# Patient Record
Sex: Male | Born: 1975 | Race: Black or African American | Hispanic: No
Health system: Southern US, Community
[De-identification: ages and names within clinical notes are randomized; demographics above are authoritative.]

## PROBLEM LIST (undated history)

## (undated) DIAGNOSIS — I10 Essential (primary) hypertension: Secondary | ICD-10-CM

---

## 2015-02-13 ENCOUNTER — Emergency Department (HOSPITAL_COMMUNITY): Payer: Medicaid - Out of State

## 2015-02-13 ENCOUNTER — Emergency Department (HOSPITAL_COMMUNITY)
Admission: EM | Admit: 2015-02-13 | Discharge: 2015-02-14 | Disposition: A | Discharge status: Home / Self Care | Payer: Medicaid - Out of State | Attending: Emergency Medicine | Admitting: Emergency Medicine

## 2015-02-13 ENCOUNTER — Encounter (HOSPITAL_COMMUNITY): Payer: Self-pay | Admitting: Emergency Medicine

## 2015-02-13 DIAGNOSIS — R000 Tachycardia, unspecified: Secondary | ICD-10-CM | POA: Diagnosis not present

## 2015-02-13 DIAGNOSIS — R110 Nausea: Secondary | ICD-10-CM

## 2015-02-13 DIAGNOSIS — F1090 Alcohol use, unspecified, uncomplicated: Secondary | ICD-10-CM

## 2015-02-13 DIAGNOSIS — Z79899 Other long term (current) drug therapy: Secondary | ICD-10-CM | POA: Insufficient documentation

## 2015-02-13 DIAGNOSIS — I10 Essential (primary) hypertension: Secondary | ICD-10-CM | POA: Insufficient documentation

## 2015-02-13 DIAGNOSIS — Z7289 Other problems related to lifestyle: Secondary | ICD-10-CM

## 2015-02-13 DIAGNOSIS — Z789 Other specified health status: Secondary | ICD-10-CM

## 2015-02-13 DIAGNOSIS — F10929 Alcohol use, unspecified with intoxication, unspecified: Secondary | ICD-10-CM | POA: Diagnosis not present

## 2015-02-13 DIAGNOSIS — R079 Chest pain, unspecified: Secondary | ICD-10-CM | POA: Diagnosis present

## 2015-02-13 DIAGNOSIS — R0789 Other chest pain: Secondary | ICD-10-CM

## 2015-02-13 HISTORY — DX: Essential (primary) hypertension: I10

## 2015-02-13 LAB — URINALYSIS, ROUTINE W REFLEX MICROSCOPIC
BILIRUBIN URINE: NEGATIVE
GLUCOSE, UA: 100 mg/dL — AB
HGB URINE DIPSTICK: NEGATIVE
KETONES UR: NEGATIVE mg/dL
Leukocytes, UA: NEGATIVE
Nitrite: NEGATIVE
PH: 6.5 (ref 5.0–8.0)
Protein, ur: 30 mg/dL — AB
SPECIFIC GRAVITY, URINE: 1.026 (ref 1.005–1.030)

## 2015-02-13 LAB — COMPREHENSIVE METABOLIC PANEL
ALBUMIN: 4.3 g/dL (ref 3.5–5.0)
ALT: 17 U/L (ref 17–63)
ANION GAP: 11 (ref 5–15)
AST: 21 U/L (ref 15–41)
Alkaline Phosphatase: 67 U/L (ref 38–126)
BUN: 9 mg/dL (ref 6–20)
CHLORIDE: 107 mmol/L (ref 101–111)
CO2: 26 mmol/L (ref 22–32)
Calcium: 8.7 mg/dL — ABNORMAL LOW (ref 8.9–10.3)
Creatinine, Ser: 0.75 mg/dL (ref 0.61–1.24)
GFR calc Af Amer: >60 mL/min (ref >60)
Glucose, Bld: 142 mg/dL — ABNORMAL HIGH (ref 65–99)
POTASSIUM: 3.5 mmol/L (ref 3.5–5.1)
Sodium: 144 mmol/L (ref 135–145)
Total Bilirubin: 0.5 mg/dL (ref 0.3–1.2)
Total Protein: 7.5 g/dL (ref 6.5–8.1)

## 2015-02-13 LAB — CBC WITH DIFFERENTIAL/PLATELET
BASOS ABS: 0 10*3/uL (ref 0.0–0.1)
BASOS PCT: 0 %
Eosinophils Absolute: 0.1 10*3/uL (ref 0.0–0.7)
Eosinophils Relative: 1 %
HEMATOCRIT: 46.4 % (ref 39.0–52.0)
HEMOGLOBIN: 16.1 g/dL (ref 13.0–17.0)
LYMPHS PCT: 28 %
Lymphs Abs: 1.6 10*3/uL (ref 0.7–4.0)
MCH: 33.7 pg (ref 26.0–34.0)
MCHC: 34.7 g/dL (ref 30.0–36.0)
MCV: 97.1 fL (ref 78.0–100.0)
MONO ABS: 0.6 10*3/uL (ref 0.1–1.0)
Monocytes Relative: 11 %
NEUTROS ABS: 3.4 10*3/uL (ref 1.7–7.7)
NEUTROS PCT: 60 %
Platelets: 212 10*3/uL (ref 150–400)
RBC: 4.78 MIL/uL (ref 4.22–5.81)
RDW: 14.2 % (ref 11.5–15.5)
WBC: 5.7 10*3/uL (ref 4.0–10.5)

## 2015-02-13 LAB — I-STAT TROPONIN, ED
Troponin i, poc: 0 ng/mL (ref 0.00–0.08)
Troponin i, poc: 0 ng/mL (ref 0.00–0.08)

## 2015-02-13 LAB — URINE MICROSCOPIC-ADD ON: BACTERIA UA: NONE SEEN

## 2015-02-13 LAB — RAPID URINE DRUG SCREEN, HOSP PERFORMED
AMPHETAMINES: NOT DETECTED
BARBITURATES: NOT DETECTED
BENZODIAZEPINES: NOT DETECTED
COCAINE: NOT DETECTED
Opiates: NOT DETECTED
TETRAHYDROCANNABINOL: NOT DETECTED

## 2015-02-13 LAB — ACETAMINOPHEN LEVEL: Acetaminophen (Tylenol), Serum: <10 ug/mL — ABNORMAL LOW (ref 10–30)

## 2015-02-13 LAB — CBG MONITORING, ED: GLUCOSE-CAPILLARY: 118 mg/dL — AB (ref 65–99)

## 2015-02-13 LAB — SALICYLATE LEVEL

## 2015-02-13 LAB — ETHANOL: ALCOHOL ETHYL (B): 119 mg/dL — AB (ref <5)

## 2015-02-13 MED ORDER — NITROGLYCERIN 0.4 MG SL SUBL: 0.4000 mg | SUBLINGUAL_TABLET | SUBLINGUAL | Status: DC | PRN

## 2015-02-13 MED ORDER — ASPIRIN 81 MG PO CHEW
324.0000 mg | CHEWABLE_TABLET | Freq: Once | ORAL | Status: AC
  Administered 2015-02-13: 324 mg via ORAL
  Filled 2015-02-13: qty 4

## 2015-02-13 MED ORDER — SODIUM CHLORIDE 0.9 % IV BOLUS (SEPSIS)
1000.0000 mL | Freq: Once | INTRAVENOUS | Status: AC
  Administered 2015-02-13: 1000 mL via INTRAVENOUS

## 2015-02-13 MED ORDER — ONDANSETRON HCL 4 MG/2ML IJ SOLN
4.0000 mg | Freq: Once | INTRAMUSCULAR | Status: AC
  Administered 2015-02-13: 4 mg via INTRAVENOUS
  Filled 2015-02-13: qty 2

## 2015-02-13 NOTE — Discharge Instructions (Signed)
Nonspecific Chest Pain  °Chest pain can be caused by many different conditions. There is always a chance that your pain could be related to something serious, such as a heart attack or a blood clot in your lungs. Chest pain can also be caused by conditions that are not life-threatening. If you have chest pain, it is very important to follow up with your health care provider. °CAUSES  °Chest pain can be caused by: °· Heartburn. °· Pneumonia or bronchitis. °· Anxiety or stress. °· Inflammation around your heart (pericarditis) or lung (pleuritis or pleurisy). °· A blood clot in your lung. °· A collapsed lung (pneumothorax). It can develop suddenly on its own (spontaneous pneumothorax) or from trauma to the chest. °· Shingles infection (varicella-zoster virus). °· Heart attack. °· Damage to the bones, muscles, and cartilage that make up your chest wall. This can include: °¨ Bruised bones due to injury. °¨ Strained muscles or cartilage due to frequent or repeated coughing or overwork. °¨ Fracture to one or more ribs. °¨ Sore cartilage due to inflammation (costochondritis). °RISK FACTORS  °Risk factors for chest pain may include: °· Activities that increase your risk for trauma or injury to your chest. °· Respiratory infections or conditions that cause frequent coughing. °· Medical conditions or overeating that can cause heartburn. °· Heart disease or family history of heart disease. °· Conditions or health behaviors that increase your risk of developing a blood clot. °· Having had chicken pox (varicella zoster). °SIGNS AND SYMPTOMS °Chest pain can feel like: °· Burning or tingling on the surface of your chest or deep in your chest. °· Crushing, pressure, aching, or squeezing pain. °· Dull or sharp pain that is worse when you move, cough, or take a deep breath. °· Pain that is also felt in your back, neck, shoulder, or arm, or pain that spreads to any of these areas. °Your chest pain may come and go, or it may stay  constant. °DIAGNOSIS °Lab tests or other studies may be needed to find the cause of your pain. Your health care provider may have you take a test called an ambulatory ECG (electrocardiogram). An ECG records your heartbeat patterns at the time the test is performed. You may also have other tests, such as: °· Transthoracic echocardiogram (TTE). During echocardiography, sound waves are used to create a picture of all of the heart structures and to look at how blood flows through your heart. °· Transesophageal echocardiogram (TEE). This is a more advanced imaging test that obtains images from inside your body. It allows your health care provider to see your heart in finer detail. °· Cardiac monitoring. This allows your health care provider to monitor your heart rate and rhythm in real time. °· Holter monitor. This is a portable device that records your heartbeat and can help to diagnose abnormal heartbeats. It allows your health care provider to track your heart activity for several days, if needed. °· Stress tests. These can be done through exercise or by taking medicine that makes your heart beat more quickly. °· Blood tests. °· Imaging tests. °TREATMENT  °Your treatment depends on what is causing your chest pain. Treatment may include: °· Medicines. These may include: °¨ Acid blockers for heartburn. °¨ Anti-inflammatory medicine. °¨ Pain medicine for inflammatory conditions. °¨ Antibiotic medicine, if an infection is present. °¨ Medicines to dissolve blood clots. °¨ Medicines to treat coronary artery disease. °· Supportive care for conditions that do not require medicines. This may include: °¨ Resting. °¨ Applying heat   or cold packs to injured areas. °¨ Limiting activities until pain decreases. °HOME CARE INSTRUCTIONS °· If you were prescribed an antibiotic medicine, finish it all even if you start to feel better. °· Avoid any activities that bring on chest pain. °· Do not use any tobacco products, including  cigarettes, chewing tobacco, or electronic cigarettes. If you need help quitting, ask your health care provider. °· Do not drink alcohol. °· Take medicines only as directed by your health care provider. °· Keep all follow-up visits as directed by your health care provider. This is important. This includes any further testing if your chest pain does not go away. °· If heartburn is the cause for your chest pain, you may be told to keep your head raised (elevated) while sleeping. This reduces the chance that acid will go from your stomach into your esophagus. °· Make lifestyle changes as directed by your health care provider. These may include: °¨ Getting regular exercise. Ask your health care provider to suggest some activities that are safe for you. °¨ Eating a heart-healthy diet. A registered dietitian can help you to learn healthy eating options. °¨ Maintaining a healthy weight. °¨ Managing diabetes, if necessary. °¨ Reducing stress. °SEEK MEDICAL CARE IF: °· Your chest pain does not go away after treatment. °· You have a rash with blisters on your chest. °· You have a fever. °SEEK IMMEDIATE MEDICAL CARE IF:  °· Your chest pain is worse. °· You have an increasing cough, or you cough up blood. °· You have severe abdominal pain. °· You have severe weakness. °· You faint. °· You have chills. °· You have sudden, unexplained chest discomfort. °· You have sudden, unexplained discomfort in your arms, back, neck, or jaw. °· You have shortness of breath at any time. °· You suddenly start to sweat, or your skin gets clammy. °· You feel nauseous or you vomit. °· You suddenly feel light-headed or dizzy. °· Your heart begins to beat quickly, or it feels like it is skipping beats. °These symptoms may represent a serious problem that is an emergency. Do not wait to see if the symptoms will go away. Get medical help right away. Call your local emergency services (911 in the [place] Do not drive yourself to the hospital. °  °This  information is not intended to replace advice given to you by your health care provider. Make sure you discuss any questions you have with your health care provider. °  °Document Released: 11/26/2004 Document Revised: 03/09/2014 Document Reviewed: 09/22/2013 °[employer]vier Interactive Patient Education ©2016 [employer] ° °
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
## 2015-02-13 NOTE — ED Notes (Signed)
Nurse drawing labs. 
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
## 2016-06-30 IMAGING — CR DG CHEST 2V
2 series · 2 of 2 positions shown · non-contrast
Comparison: None.

CLINICAL DATA: Chest pain

EXAM:
CHEST  2 VIEW

[w chest pa]
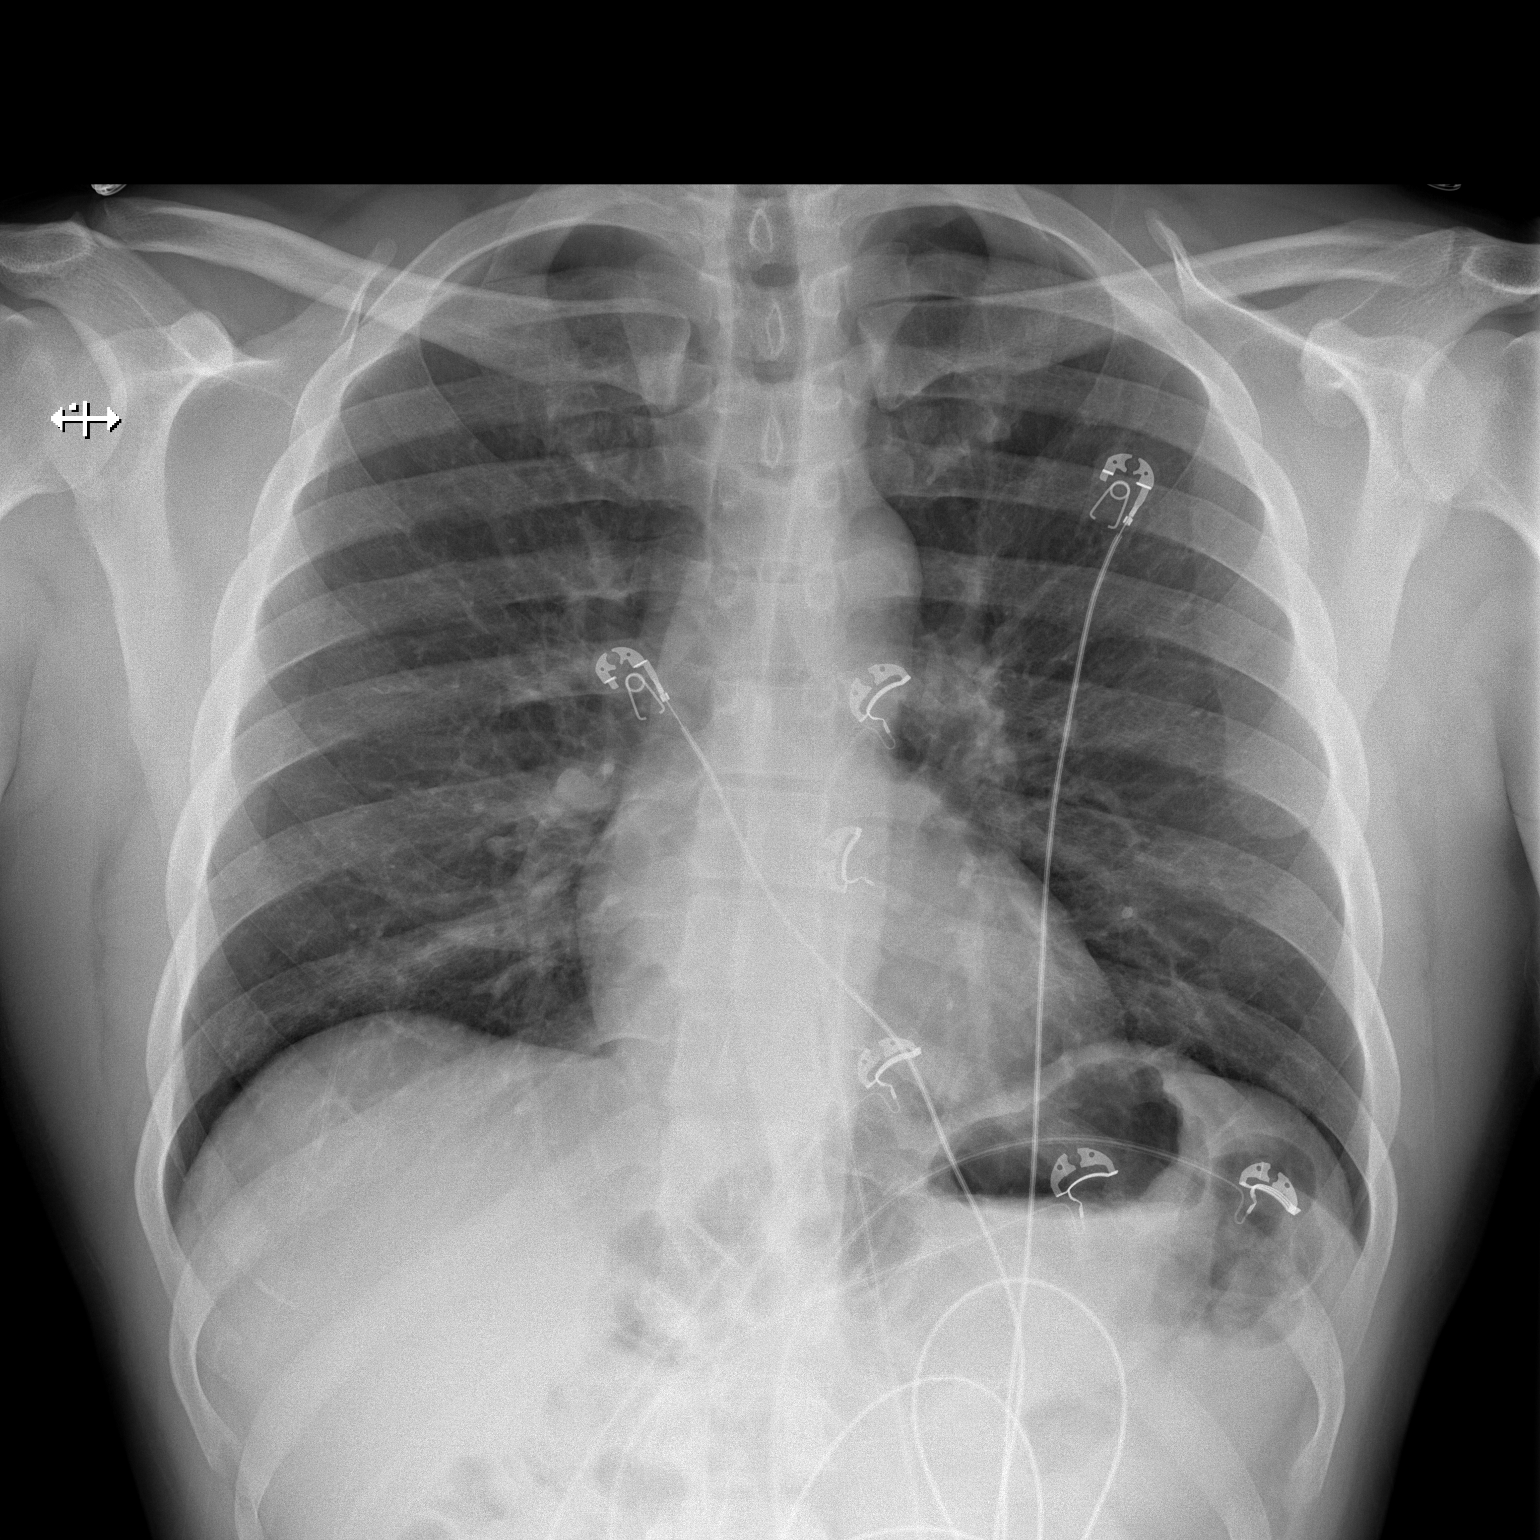

[w chest lat]
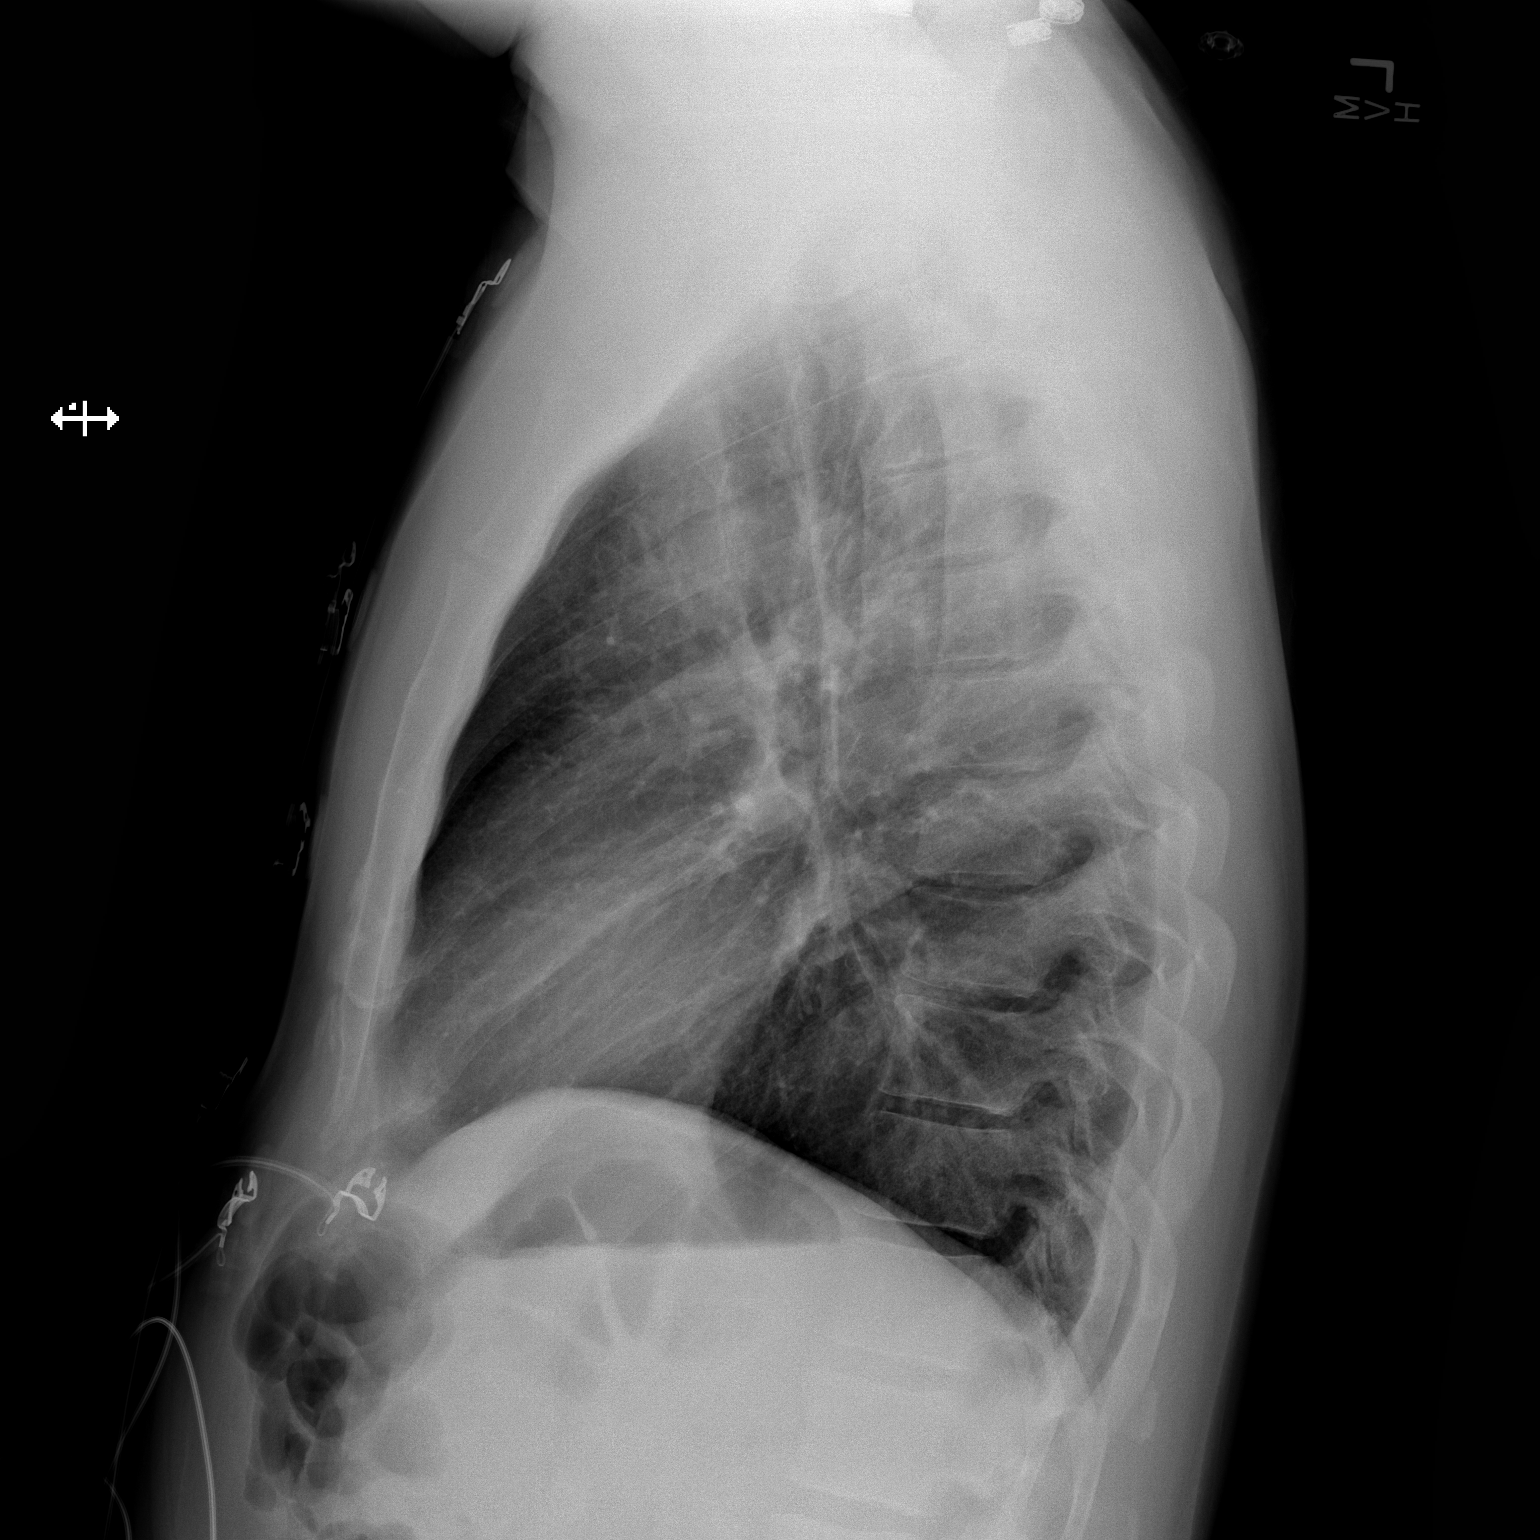

[2 of 2 positions shown; findings below may reference images not displayed]

FINDINGS: Lungs are clear. Heart size and pulmonary vascularity are normal. No
pneumothorax. No adenopathy. No bone lesions.
IMPRESSION: No abnormality noted.
